# Patient Record
Sex: Female | Born: 1965 | Hispanic: No | Marital: Married | Smoking: Never smoker
Health system: Southern US, Community
[De-identification: ages and names within clinical notes are randomized; demographics above are authoritative.]

## PROBLEM LIST (undated history)

## (undated) DIAGNOSIS — M1990 Unspecified osteoarthritis, unspecified site: Secondary | ICD-10-CM

## (undated) DIAGNOSIS — F329 Major depressive disorder, single episode, unspecified: Secondary | ICD-10-CM

## (undated) DIAGNOSIS — D649 Anemia, unspecified: Secondary | ICD-10-CM

## (undated) DIAGNOSIS — G43909 Migraine, unspecified, not intractable, without status migrainosus: Secondary | ICD-10-CM

## (undated) DIAGNOSIS — F32A Depression, unspecified: Secondary | ICD-10-CM

## (undated) HISTORY — DX: Migraine, unspecified, not intractable, without status migrainosus: G43.909

## (undated) HISTORY — DX: Depression, unspecified: F32.A

## (undated) HISTORY — DX: Unspecified osteoarthritis, unspecified site: M19.90

## (undated) HISTORY — DX: Major depressive disorder, single episode, unspecified: F32.9

## (undated) HISTORY — PX: LUMBAR DISC SURGERY: SHX700

## (undated) HISTORY — DX: Anemia, unspecified: D64.9

## (undated) HISTORY — PX: KNEE ARTHROSCOPY: SUR90

## (undated) HISTORY — PX: COLONOSCOPY: SHX174

---
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
## 1998-04-12 ENCOUNTER — Other Ambulatory Visit: Admission: RE | Admit: 1998-04-12 | Discharge: 1998-04-12 | Payer: Self-pay | Admitting: Obstetrics and Gynecology

## 1998-11-05 ENCOUNTER — Inpatient Hospital Stay (HOSPITAL_COMMUNITY): Admission: AD | Admit: 1998-11-05 | Discharge: 1998-11-08 | Payer: Self-pay | Admitting: Obstetrics and Gynecology

## 1998-12-08 ENCOUNTER — Emergency Department (HOSPITAL_COMMUNITY): Admission: EM | Admit: 1998-12-08 | Discharge: 1998-12-08 | Payer: Self-pay | Admitting: Emergency Medicine

## 1998-12-08 ENCOUNTER — Encounter: Payer: Self-pay | Admitting: Emergency Medicine

## 1998-12-16 ENCOUNTER — Other Ambulatory Visit: Admission: RE | Admit: 1998-12-16 | Discharge: 1998-12-16 | Payer: Self-pay | Admitting: Obstetrics and Gynecology

## 2001-01-27 ENCOUNTER — Encounter (INDEPENDENT_AMBULATORY_CARE_PROVIDER_SITE_OTHER): Payer: Self-pay | Admitting: Specialist

## 2001-01-27 ENCOUNTER — Inpatient Hospital Stay (HOSPITAL_COMMUNITY): Admission: AD | Admit: 2001-01-27 | Discharge: 2001-01-31 | Payer: Self-pay | Admitting: Obstetrics and Gynecology

## 2001-03-09 ENCOUNTER — Other Ambulatory Visit: Admission: RE | Admit: 2001-03-09 | Discharge: 2001-03-09 | Payer: Self-pay | Admitting: Obstetrics and Gynecology

## 2002-02-07 ENCOUNTER — Encounter: Admission: RE | Admit: 2002-02-07 | Discharge: 2002-02-07 | Payer: Self-pay | Admitting: Internal Medicine

## 2002-02-07 ENCOUNTER — Encounter (HOSPITAL_BASED_OUTPATIENT_CLINIC_OR_DEPARTMENT_OTHER): Payer: Self-pay | Admitting: Internal Medicine

## 2002-03-10 ENCOUNTER — Other Ambulatory Visit: Admission: RE | Admit: 2002-03-10 | Discharge: 2002-03-10 | Payer: Self-pay | Admitting: Obstetrics and Gynecology

## 2003-06-15 ENCOUNTER — Encounter: Payer: Self-pay | Admitting: Obstetrics and Gynecology

## 2003-06-15 ENCOUNTER — Ambulatory Visit (HOSPITAL_COMMUNITY): Admission: RE | Admit: 2003-06-15 | Discharge: 2003-06-15 | Payer: Self-pay | Admitting: Obstetrics and Gynecology

## 2003-10-30 ENCOUNTER — Other Ambulatory Visit: Admission: RE | Admit: 2003-10-30 | Discharge: 2003-10-30 | Payer: Self-pay | Admitting: Obstetrics and Gynecology

## 2003-12-28 ENCOUNTER — Inpatient Hospital Stay (HOSPITAL_COMMUNITY): Admission: AD | Admit: 2003-12-28 | Discharge: 2003-12-28 | Payer: Self-pay | Admitting: Obstetrics and Gynecology

## 2004-06-27 ENCOUNTER — Inpatient Hospital Stay (HOSPITAL_COMMUNITY): Admission: AD | Admit: 2004-06-27 | Discharge: 2004-06-27 | Payer: Self-pay | Admitting: Obstetrics & Gynecology

## 2004-06-29 ENCOUNTER — Inpatient Hospital Stay (HOSPITAL_COMMUNITY): Admission: AD | Admit: 2004-06-29 | Discharge: 2004-06-29 | Payer: Self-pay | Admitting: Obstetrics & Gynecology

## 2007-01-05 ENCOUNTER — Ambulatory Visit: Payer: Self-pay | Admitting: Gastroenterology

## 2007-01-17 ENCOUNTER — Ambulatory Visit: Payer: Self-pay | Admitting: Gastroenterology

## 2008-02-28 ENCOUNTER — Emergency Department (HOSPITAL_COMMUNITY): Admission: EM | Admit: 2008-02-28 | Discharge: 2008-02-28 | Payer: Self-pay | Admitting: Emergency Medicine

## 2008-12-24 IMAGING — CT CT HEAD W/O CM
3 of 5 series · 16 of 47 positions shown, 19 images · IV contrast (agent unspecified)
Comparison: None.
COMPARISON: None.

CLINICAL DATA: Fell - complains of head and neck pain.  
 HEAD CT WITHOUT CONTRAST:
TECHNIQUE: Contiguous axial images were obtained from the base of the skull through the vertex according to standard protocol without contrast.
TECHNIQUE: Multidetector CT imaging of the cervical spine was performed.  Multiplanar CT image reconstructions were also generated.

[Series 602: coronal · coronal · 0.32mm/px · 3 of 53 slices shown]
[im 18/53  brain]
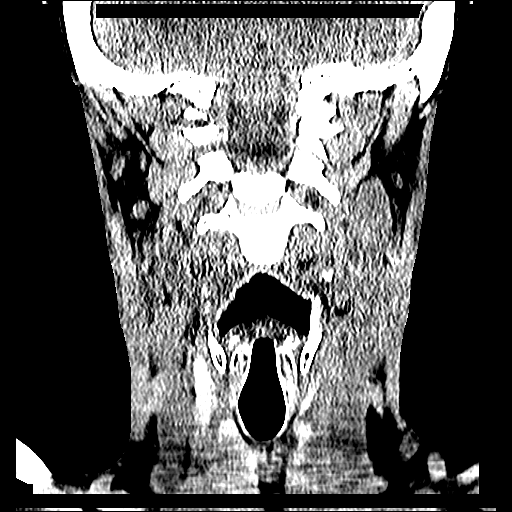
[im 24/53  brain]
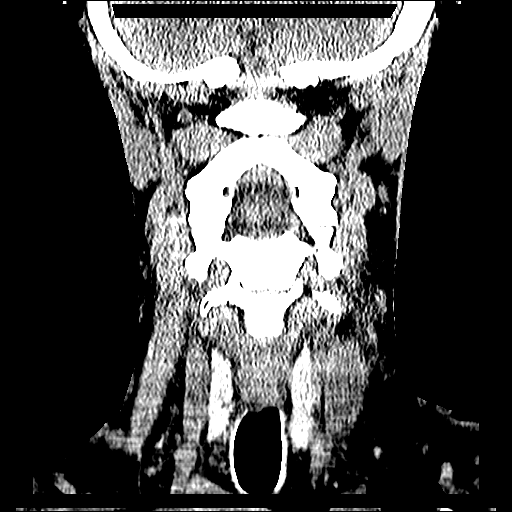
[im 29/53  brain]
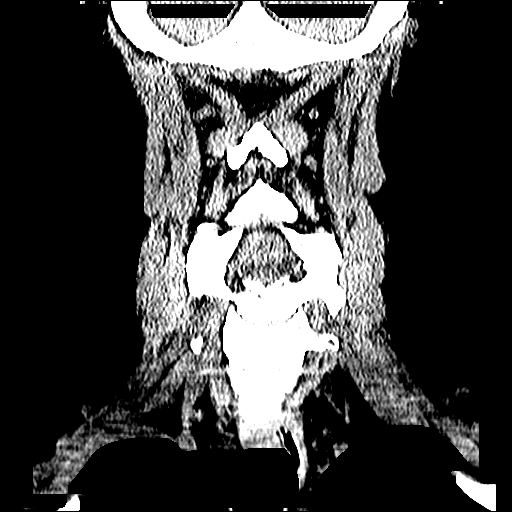

[Series 603: sagittal · sagittal · 0.32mm/px · 3 of 55 slices shown]
[im 19/55  brain]
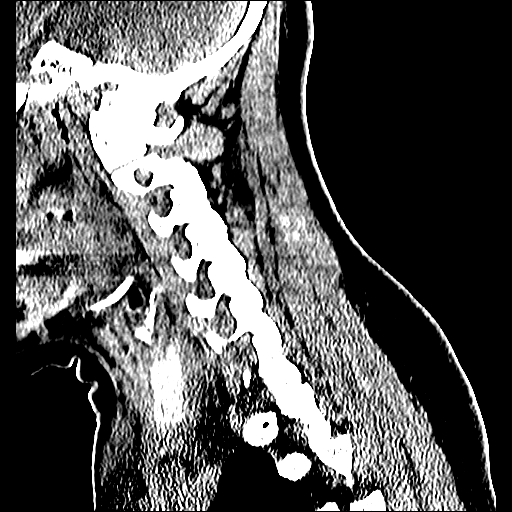
[im 28/55  brain]
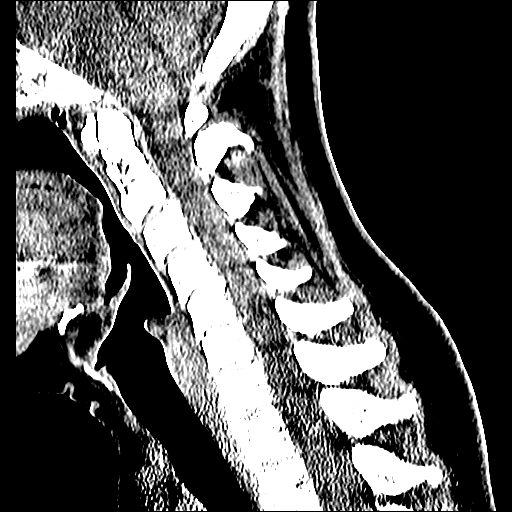
[im 37/55  brain]
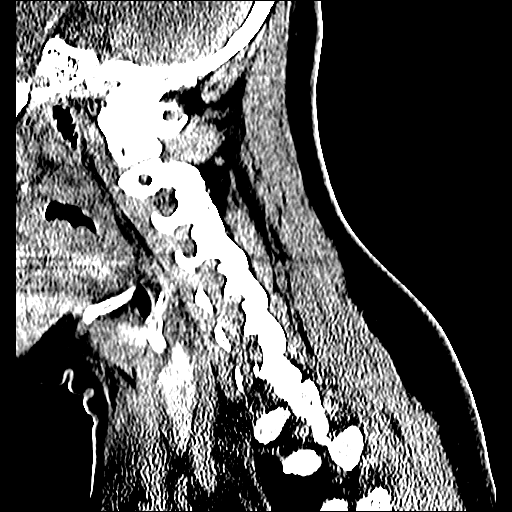

[Series 604: orthogonal · axial · 0.32mm/px · z∈[+1035,+1167]mm · 10 of 85 slices shown, 13 images]
[im 8/85  brain]
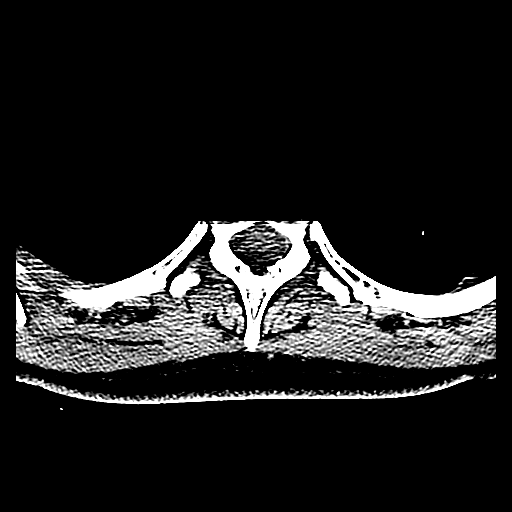
[im 8/85  bone]
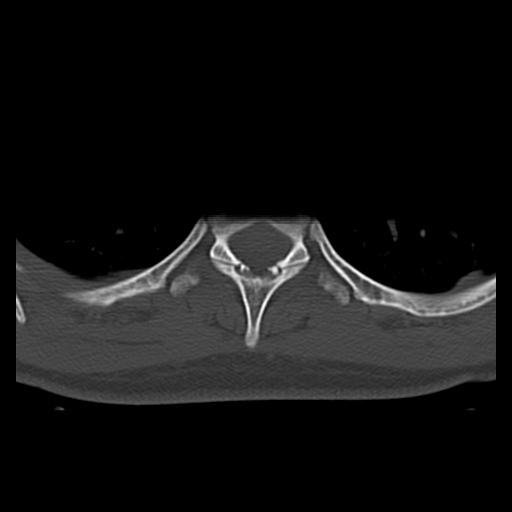
[im 15/85  brain]
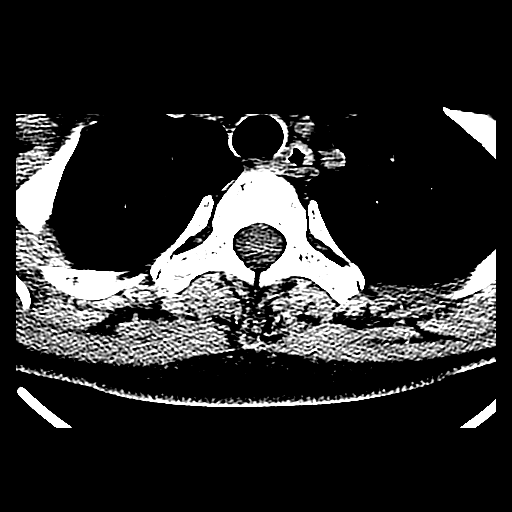
[im 22/85  brain]
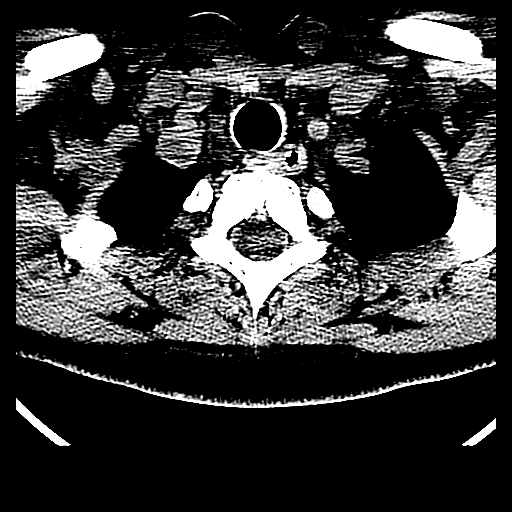
[im 29/85  brain]
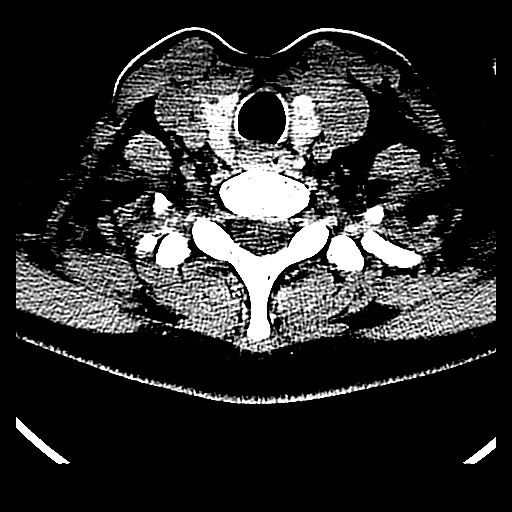
[im 36/85  brain]
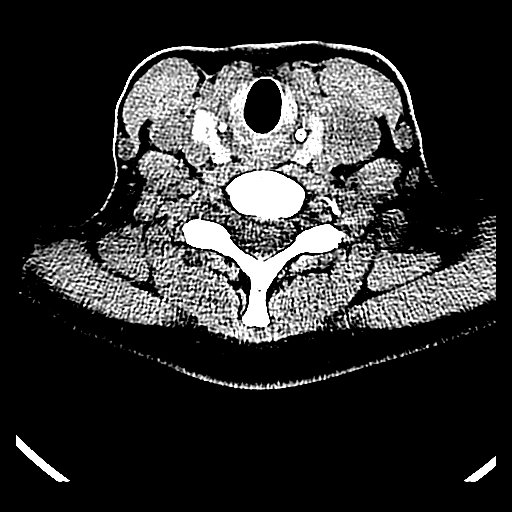
[im 36/85  bone]
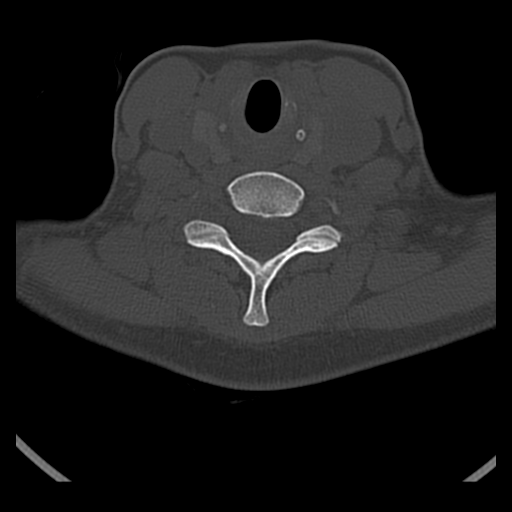
[im 50/85  brain]
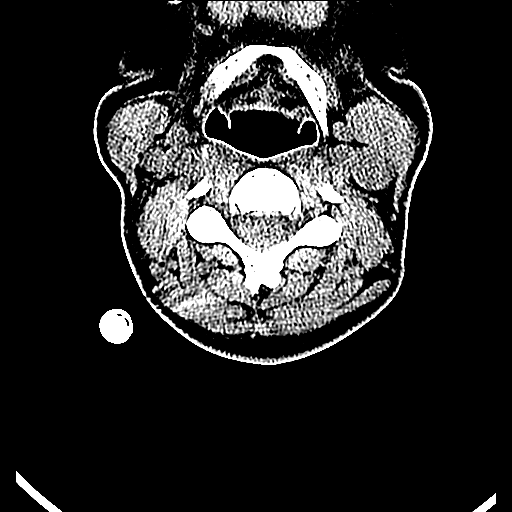
[im 57/85  brain]
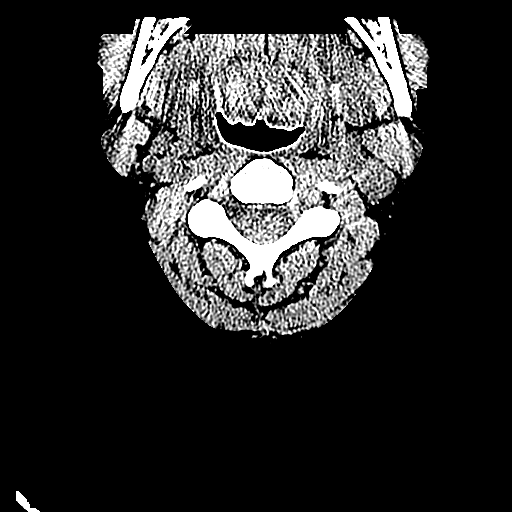
[im 64/85  brain]
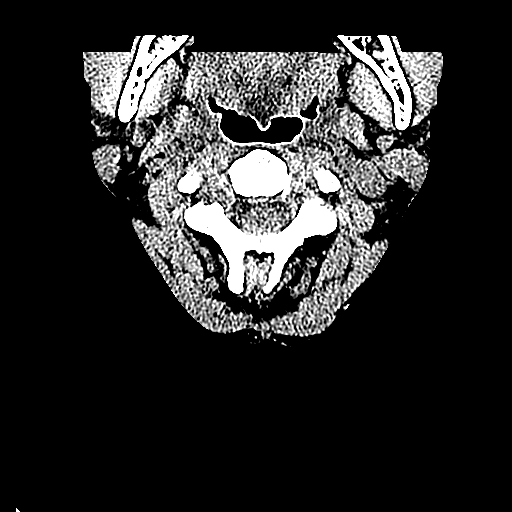
[im 71/85  brain]
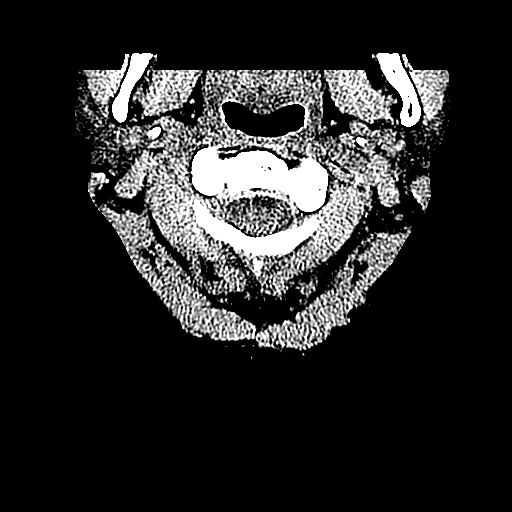
[im 71/85  bone]
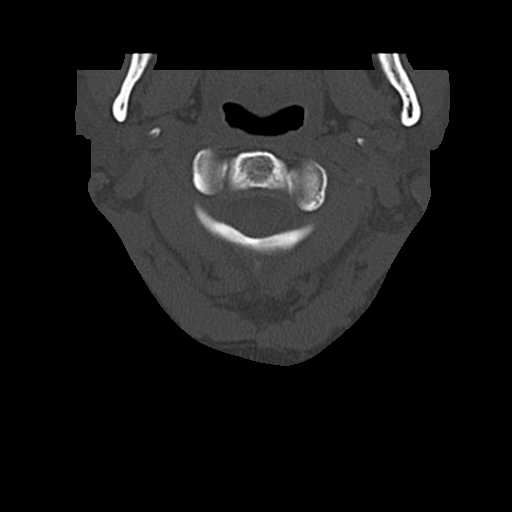
[im 78/85  brain]
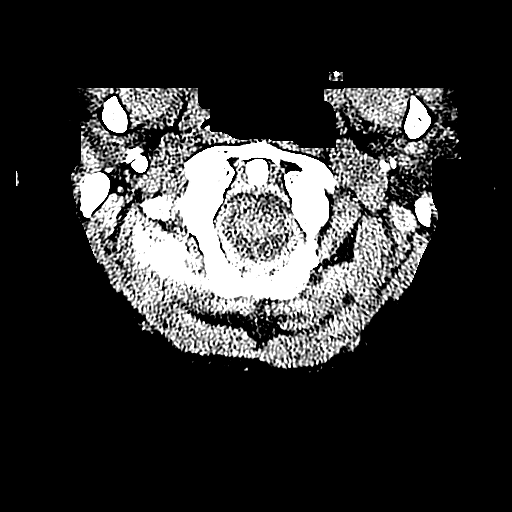

[16 of 47 positions shown; findings below may reference images not displayed]

FINDINGS: There is no evidence of intracranial hemorrhage, brain edema, acute infarct, mass lesion, or mass effect.  No other intra-axial abnormalities are seen, and the ventricles are within normal limits.  No abnormal extra-axial fluid collections or masses are identified.  No skull abnormalities are noted.
IMPRESSION: Negative non-contrast head CT.
 CERVICAL SPINE CT WITHOUT CONTRAST:
FINDINGS: In three planes, there is no subluxation or fractures.  No spinal or foraminal stenosis.  Prevertebral soft tissues normal. 
 Mild disk narrowing at C4-5.
IMPRESSION: No acute findings.

## 2011-05-12 NOTE — Consult Note (Signed)
Alicia Lopez, ATTRIDGE NO.:  [id number]   MEDICAL RECORD NO.:  [id number]          PATIENT TYPE:  EMS   LOCATION:  [facility]                         FACILITY:  [facility]   PHYSICIAN:  Donalee Citrin, M.D.        DATE OF BIRTH:  10/21/1966   DATE OF CONSULTATION:  02/28/2008  DATE OF DISCHARGE:                                 CONSULTATION   REASON FOR CONSULTATION:  Head injury.   HISTORY OF PRESENT ILLNESS:  Patient is a 45 year old female who was  walking, slipped on the ice, fell back striking the back of her head on  the ice and concrete.  The patient denies any loss of consciousness.  Denies any amnesia for the event.  Reports pain is all localized around  the occiput and some stiffness is developing in her neck.  There are no  vision changes.  No nausea or vomiting.  She is experiencing numbness  and tingling in her right hand that began only recently.  No symptoms in  the left arm or lower extremities.   PHYSICAL EXAMINATION:  GENERAL:  On exam, the patient is an awake and  alert 45 year old female in no acute distress.  HEENT:  Within normal limits.  Pupils are equal, round and reactive to  light.  Extraocular movements are intact.  Cranial nerves are intact.  NECK:  Full range of motion with some exacerbation of the stiffness in  extreme extension and flexion.  NEUROLOGIC:  Strength is 5/5 in her deltoids, biceps, triceps, wrist  flexion and hand intrinsics.  Motor strength is 5/5 in the iliopsoas,  quads, hamstrings, gastrocs and EHL Reflexes appear to be brisk, but  symmetric and nonpathologic.  Sensation to light touch is grossly  intact, although she does report paresthesias in the right hand.   RECOMMENDATIONS:  With significant occipital trauma, I have recommended  CT of her head.  We will get a  C-spine with complaints of numbness in her right hand.  If the numbness  persists, I may ultimately have to get an MRI.  If the CT scan is  normal, we will send her  home with a head injury sheet.  Otherwise, we  will defer to the results of the CT scan.           ______________________________  Donalee Citrin, M.D.     GC/MEDQ  D:  02/28/2008  T:  02/28/2008  Job:  [id number]

## 2011-05-15 NOTE — Op Note (Signed)
[facility] [place]  Patient:    Alicia Lopez, Alicia Lopez                      MRN: [id number] Proc. Date: 01/28/01 Adm. Date:  [id number] Attending:  Cordelia Pen Ii                           Operative Report  PREOPERATIVE DIAGNOSES:       1. Intrauterine pregnancy at 38-2/7 weeks.                               2. Previous cesarean section, desires repeat.  POSTOPERATIVE DIAGNOSES:      1. Intrauterine pregnancy at 38-2/7 weeks.                               2. Previous cesarean section, desires repeat.  PROCEDURES:                   1. Repeat low transverse cesarean section.                               2. Bilateral tubal ligation.  SURGEON:                      Guy Sandifer. Arleta Creek, M.D.  ANESTHESIA:                   Spinal.  ANESTHESIOLOGIST:             Ellison Hughs., M.D.  ESTIMATED BLOOD LOSS:         800 cc.  FINDINGS:                     A viable female infant; Apgars of 8 and 9 at one and five minutes, respectively.  Birth weight 8 pounds 9 ounces.  Arterial cord pH 7.18.  INDICATIONS AND CONSENT:      This patient is a 45 year old married white female, G2, P35, with an EDC of January 29, 2001.  Prenatal care was complicated by advanced maternal age, with a normal AFP noted.  The patient had previous cesarean section, desires a repeat and desires permanent sterilization.  Discussed with patient the procedure.  Discussed tubal ligation with permanence, failure rate and increased ectopic risks.  All questions answered.  DESCRIPTION OF PROCEDURE:     The patient was taken to the operating room, where a spinal anesthetic was placed.  She was then placed in the dorsosupine position with a 15-degree left lateral wedge.  She was prepped and a Foley catheter was placed, and then she was draped in a sterile fashion.  After testing for adequate spinal anesthesia, the old Pfannenstiel scar was resected on the way and dissection was carried  out in layers.  Peritoneum is incised, extended superiorly and inferiorly.  Vesicouterine peritoneum was taken down cephalolaterally, the bladder flap is developed and the bladder blade is placed.  The scar in the lower uterine segment is noted to be exceedingly thin on the uterus.  The uterus is incised in a low transverse manner in an area just above the thin scar, as noted above.  The uterine cavity is then entered bluntly with a Kelly clamp.  Clear fluid is noted.  The uterine incision is extended cephalolaterally with the fingers.  Vertex is delivered and the oronasopharynx is suctioned.  The remainder of the infant is delivered.  Good cry and tone is noted.  The cord is doubly clamped and cut, and the infant is handed to the awaiting pediatric team.  The placenta is manually delivered.  Anterior uterine contour is normal. Uterus is closed in a running locking fashion with 0 Monocryl suture.  An additional figure-of-eight is used at the right angle to achieve complete hemostasis.  Ovaries are normal bilaterally.  The left fallopian tube is then identified from cornua to fimbria, grasped at its mid ampullary portion and a knuckle of the tube is then doubly ligated with two 0 plain ties.  The intervening knuckle of tissue is then resected and cautery is used to obtain hemostasis completely.  A similar procedure is carried out on the fallopian tube.  Copious irrigation was carried out.  Anterior peritoneum was then closed in a running fashion with 0 Monocryl suture, which is also used to reapproximate the pyramidalis muscle in the midline.  The fascia was closed in running fashion with 0 Panacryl suture. The skin was closed with clips.  All sponge, needle and instrument counts were correct.  The patient was transferred to the recovery room in stable condition. DD:  01/28/01 TD:  01/29/01 Job: [id number]

## 2011-05-15 NOTE — H&P (Signed)
[facility]  Patient:    Alicia Lopez, Alicia Lopez                        MRN: [id number] Attending:  Duke Salvia. Marcelle Overlie, M.D.                         History and Physical  SCHEDULED SURGERY DATE:       January 28, 2001.  CHIEF COMPLAINT:              For repeat cesarean section, tubal ligation.  HISTORY OF PRESENT ILLNESS:   A 45 year old, G2, P1-0-0-1, previous LTCS in 1999 for macrosomia. She delivered a 9-pound 15-ounce female at that time. She declines VBAC. Her EDD is February 03, 2001. She presents now at term for repeat cesarean section and tubal ligation. She declined amniocentesis. Her AFP screen was negative. Her one-hour GTT was 66, which was normal. Blood type is A positive, rubella titer is positive.  PAST MEDICAL HISTORY:  ALLERGIES:                    None.  OPERATIONS:                   Cesarean section. Tonsillectomy. Knee surgery x 2.  REVIEW OF SYSTEMS:            Significant for IBS.  CURRENT MEDICATIONS:          Prenatal vitamins.  PHYSICAL EXAMINATION:  VITAL SIGNS:                  Temperature 98.2, blood pressure 120/72.  HEENT:                        Unremarkable.  NECK:                         Supple without masses.  LUNGS:                        Clear.  CARDIOVASCULAR:               Regular rate and rhythm without murmurs, rubs, or gallops noted.  BREASTS:                      Not examined.  ABDOMEN:                      Term fetal height. Fetal heart rate 140s.  PELVIC:                       Cervix was closed.  EXTREMITIES:                  Unremarkable.  NEUROLOGIC:                   Unremarkable.  IMPRESSION:                   1. Term intrauterine pregnancy.                               2. Previous cesarean section, declines  vaginal birth after cesarean section.  PLAN:                         Repeat cesarean section, tubal ligation. This procedure, including risks relative  to bleeding, transfusion, adjacent organ injury were discussed. The permanence of the tubal procedure and failure rate of 2-3:1000 discussed with her, which she understands and accepts also. DD:  01/24/01 TD:  01/24/01 Job: [id number]

## 2011-05-15 NOTE — Discharge Summary (Signed)
[facility] [place]  Patient:    Alicia Lopez, Alicia Lopez                      MRN: [id number] Adm. Date:  [id number] Disc. Date: [id number] Attending:  Cordelia Pen Ii Dictator:   Danie Chandler, R.N.                           Discharge Summary  ADMITTING DIAGNOSES:          1. Intrauterine pregnancy at 38 2/[redacted] weeks                                  gestation.                               2. Previous cesarean section, desires repeat.  DISCHARGE DIAGNOSES:          1. Intrauterine pregnancy at 38 2/[redacted] weeks                                  gestation.                               2. Previous cesarean section, desires repeat.  PROCEDURE:                    On January 28, 2001 repeat low transverse cesarean section and bilateral tubal ligation.  REASON FOR ADMISSION:         The patient is a 45 year old married white female gravida 2, para 1 with an estimated date of confinement January 29, 2001.  The patients prenatal care was complicated by advanced maternal age for which she underwent an AFP test and this returned as normal.  The patient had a previous cesarean section and desired a repeat and also desired permanent sterilization.  HOSPITAL COURSE:              The patient was taken to the operating room and underwent the above named procedure without complication.  This was productive of a viable female infant with Apgars of 8 at one minute and 9 at five minutes and an arterial cord pH of 7.18.  Postoperatively on day #1 the patient had good control of pain.  Her hemoglobin was 10.5, hematocrit 29.4, white blood cell count 9.0.  On postoperative day #2 she was without complaints.  She was tolerating a regular diet, ambulating well without difficulty.  She also had a good return of bowel function.  She was discharged home on postoperative day #4.  CONDITION ON DISCHARGE:       Good.  DIET:                         Regular, as tolerated.  ACTIVITY:                      No heavy lifting, no driving, no vaginal entry.  FOLLOW-UP:                    She is to follow-up in the office in one to two weeks for incision check.  She  is to call for temperature greater than 100 degrees, persistent nausea or vomiting, heavy vaginal bleeding, and/or redness or drainage from the incision site.  DISCHARGE MEDICATIONS:        1. Prenatal vitamin one p.o. q.d.                               2. Darvocet-N 100 one p.o. q.4h. p.r.n. pain.DD: 02/22/01 TD:  02/22/01 Job: [id number]

## 2012-01-08 ENCOUNTER — Encounter: Payer: Self-pay | Admitting: Gastroenterology

## 2012-05-02 ENCOUNTER — Encounter: Payer: Self-pay | Admitting: Gastroenterology

## 2012-05-25 ENCOUNTER — Encounter: Payer: Self-pay | Admitting: Gastroenterology

## 2012-06-10 ENCOUNTER — Ambulatory Visit (AMBULATORY_SURGERY_CENTER): Payer: BC Managed Care – PPO | Admitting: *Deleted

## 2012-06-10 VITALS — Ht 63.0 in | Wt 140.0 lb

## 2012-06-10 DIAGNOSIS — Z1211 Encounter for screening for malignant neoplasm of colon: Secondary | ICD-10-CM

## 2012-06-10 MED ORDER — MOVIPREP 100 G PO SOLR: ORAL | Status: DC

## 2012-06-13 ENCOUNTER — Encounter: Payer: Self-pay | Admitting: Gastroenterology

## 2012-06-24 ENCOUNTER — Ambulatory Visit (AMBULATORY_SURGERY_CENTER): Payer: BC Managed Care – PPO | Admitting: Gastroenterology

## 2012-06-24 ENCOUNTER — Encounter: Payer: Self-pay | Admitting: Gastroenterology

## 2012-06-24 VITALS — BP 115/54 | HR 88 | Temp 97.0°F | Resp 22 | Ht 63.0 in | Wt 140.0 lb

## 2012-06-24 DIAGNOSIS — Z1211 Encounter for screening for malignant neoplasm of colon: Secondary | ICD-10-CM

## 2012-06-24 DIAGNOSIS — Z800 Family history of malignant neoplasm of digestive organs: Secondary | ICD-10-CM

## 2012-06-24 DIAGNOSIS — K5730 Diverticulosis of large intestine without perforation or abscess without bleeding: Secondary | ICD-10-CM

## 2012-06-24 MED ORDER — SODIUM CHLORIDE 0.9 % IV SOLN: 500.0000 mL | INTRAVENOUS | Status: DC

## 2012-06-24 NOTE — Progress Notes (Signed)
Patient did not experience any of the following events: a burn prior to discharge; a fall within the facility; wrong site/side/patient/procedure/implant event; or a hospital transfer or hospital admission upon discharge from the facility. (G8907) Patient did not have preoperative order for IV antibiotic SSI prophylaxis. (G8918)  

## 2012-06-24 NOTE — Patient Instructions (Addendum)
YOU HAD AN ENDOSCOPIC PROCEDURE TODAY AT THE [facility]: Refer to the procedure report that was given to you for any specific questions about what was found during the examination.  If the procedure report does not answer your questions, please call your gastroenterologist to clarify.  If you requested that your care partner not be given the details of your procedure findings, then the procedure report has been included in a sealed envelope for you to review at your convenience later.  YOU SHOULD EXPECT: Some feelings of bloating in the abdomen. Passage of more gas than usual.  Walking can help get rid of the air that was put into your GI tract during the procedure and reduce the bloating. If you had a lower endoscopy (such as a colonoscopy or flexible sigmoidoscopy) you may notice spotting of blood in your stool or on the toilet paper. If you underwent a bowel prep for your procedure, then you may not have a normal bowel movement for a few days.  DIET: Your first meal following the procedure should be a light meal and then it is ok to progress to your normal diet.  A half-sandwich or bowl of soup is an example of a good first meal.  Heavy or fried foods are harder to digest and may make you feel nauseous or bloated.  Likewise meals heavy in dairy and vegetables can cause extra gas to form and this can also increase the bloating.  Drink plenty of fluids but you should avoid alcoholic beverages for 24 hours.  ACTIVITY: Your care partner should take you home directly after the procedure.  You should plan to take it easy, moving slowly for the rest of the day.  You can resume normal activity the day after the procedure however you should NOT DRIVE or use heavy machinery for 24 hours (because of the sedation medicines used during the test).    SYMPTOMS TO REPORT IMMEDIATELY: A gastroenterologist can be reached at any hour.  During normal business hours, 8:30 AM to 5:00 PM Monday through Friday,  call [phone].  After hours and on weekends, please call the GI answering service at [phone] who will take a message and have the physician on call contact you.   Following lower endoscopy (colonoscopy or flexible sigmoidoscopy):  Excessive amounts of blood in the stool  Significant tenderness or worsening of abdominal pains  Swelling of the abdomen that is new, acute  Fever of 100F or higher   FOLLOW UP:  Our staff will call the home number listed on your records the next business day following your procedure to check on you and address any questions or concerns that you may have at that time regarding the information given to you following your procedure. This is a courtesy call and so if there is no answer at the home number and we have not heard from you through the emergency physician on call, we will assume that you have returned to your regular daily activities without incident.  SIGNATURES/CONFIDENTIALITY: You and/or your care partner have signed paperwork which will be entered into your electronic medical record.  These signatures attest to the fact that that the information above on your After Visit Summary has been reviewed and is understood.  Full responsibility of the confidentiality of this discharge information lies with you and/or your care-partner.   Resume your normal medications 

## 2012-06-24 NOTE — Op Note (Signed)
[facility] [address] [place], [place]  [address]  COLONOSCOPY PROCEDURE REPORT  PATIENT:  Alicia Lopez, Alicia Lopez  MR#:  [id number] BIRTHDATE:  Feb 15, 1966, 45 yrs. old  GENDER:  female ENDOSCOPIST:  Barbette Hair. Arlyce Dice, MD REF. BY:  Jarome Matin, M.D. PROCEDURE DATE:  06/24/2012 PROCEDURE:  Diagnostic Colonoscopy ASA CLASS:  Class I INDICATIONS:  Screening, family history of colon cancer Brother in 29's MEDICATIONS:   MAC sedation, administered by CRNA propofol 400mg IV  DESCRIPTION OF PROCEDURE:   After the risks benefits and alternatives of the procedure were thoroughly explained, informed consent was obtained.  Digital rectal exam was performed and revealed no abnormalities.   The LB [id number] endoscope was introduced through the anus and advanced to the cecum, which was identified by both the appendix and ileocecal valve, without limitations.  The quality of the prep was excellent, using MiraLax.  The instrument was then slowly withdrawn as the colon was fully examined. <<PROCEDUREIMAGES>>  FINDINGS:  Mild diverticulosis was found in the sigmoid colon (see image4).  This was otherwise a normal examination of the colon (see image2 and image6).   Retroflexed views in the rectum revealed no abnormalities.    The time to cecum =  1) 4.75 minutes. The scope was then withdrawn in  1) 6.50  minutes from the cecum and the procedure completed. COMPLICATIONS:  None ENDOSCOPIC IMPRESSION: 1) Mild diverticulosis in the sigmoid colon 2) Otherwise normal examination RECOMMENDATIONS: 1) Given your significant family history of colon cancer, you should have a repeat colonoscopy in 5 years REPEAT EXAM:  In 5 year(s) for Colonoscopy.  ______________________________ Barbette Hair. Arlyce Dice, MD  CC:  n. eSIGNED:   Barbette Hair. Lizbet Cirrincione at 06/24/2012 12:23 PM  Reola Calkins, [phone]

## 2012-06-27 ENCOUNTER — Telehealth: Payer: Self-pay | Admitting: *Deleted

## 2012-06-27 NOTE — Telephone Encounter (Signed)
  Follow up Call-  Call back number 06/24/2012  Post procedure Call Back phone  # [phone]  Permission to leave phone message Yes     No answer,left message.

## 2013-05-04 ENCOUNTER — Encounter (HOSPITAL_COMMUNITY): Payer: Self-pay | Admitting: Pharmacy Technician

## 2013-05-11 ENCOUNTER — Other Ambulatory Visit: Payer: Self-pay | Admitting: Obstetrics and Gynecology

## 2013-05-16 ENCOUNTER — Encounter (HOSPITAL_COMMUNITY)
Admission: RE | Disposition: A | Discharge status: Home / Self Care | Payer: Self-pay | Source: Ambulatory Visit | Attending: Obstetrics and Gynecology

## 2013-05-16 ENCOUNTER — Ambulatory Visit (HOSPITAL_COMMUNITY)
Admission: RE | Admit: 2013-05-16 | Discharge: 2013-05-16 | Disposition: A | Discharge status: Home / Self Care | Payer: 59 | Source: Ambulatory Visit | Attending: Obstetrics and Gynecology | Admitting: Obstetrics and Gynecology

## 2013-05-16 ENCOUNTER — Encounter (HOSPITAL_COMMUNITY): Payer: Self-pay | Admitting: Anesthesiology

## 2013-05-16 ENCOUNTER — Ambulatory Visit (HOSPITAL_COMMUNITY): Payer: 59 | Admitting: Anesthesiology

## 2013-05-16 ENCOUNTER — Encounter (HOSPITAL_COMMUNITY): Payer: Self-pay | Admitting: *Deleted

## 2013-05-16 DIAGNOSIS — N938 Other specified abnormal uterine and vaginal bleeding: Secondary | ICD-10-CM | POA: Insufficient documentation

## 2013-05-16 DIAGNOSIS — N920 Excessive and frequent menstruation with regular cycle: Secondary | ICD-10-CM

## 2013-05-16 DIAGNOSIS — N949 Unspecified condition associated with female genital organs and menstrual cycle: Secondary | ICD-10-CM | POA: Insufficient documentation

## 2013-05-16 DIAGNOSIS — Z803 Family history of malignant neoplasm of breast: Secondary | ICD-10-CM | POA: Insufficient documentation

## 2013-05-16 HISTORY — PX: DILITATION & CURRETTAGE/HYSTROSCOPY WITH NOVASURE ABLATION: SHX5568

## 2013-05-16 LAB — HCG, SERUM, QUALITATIVE: Preg, Serum: NEGATIVE

## 2013-05-16 LAB — CBC
Hemoglobin: 10.9 g/dL — ABNORMAL LOW (ref 12.0–15.0)
MCHC: 32.2 g/dL (ref 30.0–36.0)
WBC: 4.2 10*3/uL (ref 4.0–10.5)

## 2013-05-16 SURGERY — DILATATION & CURETTAGE/HYSTEROSCOPY WITH NOVASURE ABLATION
Anesthesia: General | Site: Vagina | Wound class: Clean Contaminated

## 2013-05-16 MED ORDER — METOCLOPRAMIDE HCL 5 MG/ML IJ SOLN: 10.0000 mg | Freq: Once | INTRAMUSCULAR | Status: DC | PRN

## 2013-05-16 MED ORDER — MEPERIDINE HCL 25 MG/ML IJ SOLN: 6.2500 mg | INTRAMUSCULAR | Status: DC | PRN

## 2013-05-16 MED ORDER — FENTANYL CITRATE 0.05 MG/ML IJ SOLN: 25.0000 ug | INTRAMUSCULAR | Status: DC | PRN

## 2013-05-16 MED ORDER — FENTANYL CITRATE 0.05 MG/ML IJ SOLN
INTRAMUSCULAR | Status: DC | PRN
  Administered 2013-05-16 (×2): 50 ug via INTRAVENOUS

## 2013-05-16 MED ORDER — MIDAZOLAM HCL 2 MG/2ML IJ SOLN
INTRAMUSCULAR | Status: AC
  Filled 2013-05-16: qty 2

## 2013-05-16 MED ORDER — BUPIVACAINE HCL (PF) 0.25 % IJ SOLN
INTRAMUSCULAR | Status: AC
  Filled 2013-05-16: qty 30

## 2013-05-16 MED ORDER — OXYCODONE-ACETAMINOPHEN 5-325 MG PO TABS: 1.0000 | ORAL_TABLET | ORAL | Status: DC | PRN

## 2013-05-16 MED ORDER — PROPOFOL 10 MG/ML IV EMUL
INTRAVENOUS | Status: AC
  Filled 2013-05-16: qty 20

## 2013-05-16 MED ORDER — FENTANYL CITRATE 0.05 MG/ML IJ SOLN
INTRAMUSCULAR | Status: AC
  Filled 2013-05-16: qty 5

## 2013-05-16 MED ORDER — CEFAZOLIN SODIUM-DEXTROSE 2-3 GM-% IV SOLR
2.0000 g | INTRAVENOUS | Status: AC
  Administered 2013-05-16: 2 g via INTRAVENOUS

## 2013-05-16 MED ORDER — DEXAMETHASONE SODIUM PHOSPHATE 4 MG/ML IJ SOLN
INTRAMUSCULAR | Status: DC | PRN
  Administered 2013-05-16: 8 mg via INTRAVENOUS

## 2013-05-16 MED ORDER — PROPOFOL 10 MG/ML IV BOLUS
INTRAVENOUS | Status: DC | PRN
  Administered 2013-05-16: 150 mg via INTRAVENOUS

## 2013-05-16 MED ORDER — LIDOCAINE HCL (CARDIAC) 20 MG/ML IV SOLN
INTRAVENOUS | Status: DC | PRN
  Administered 2013-05-16: 50 mg via INTRAVENOUS

## 2013-05-16 MED ORDER — KETOROLAC TROMETHAMINE 30 MG/ML IJ SOLN
INTRAMUSCULAR | Status: DC | PRN
  Administered 2013-05-16: 30 mg via INTRAVENOUS

## 2013-05-16 MED ORDER — ONDANSETRON HCL 4 MG/2ML IJ SOLN
INTRAMUSCULAR | Status: DC | PRN
  Administered 2013-05-16: 4 mg via INTRAVENOUS

## 2013-05-16 MED ORDER — BUPIVACAINE HCL (PF) 0.25 % IJ SOLN
INTRAMUSCULAR | Status: DC | PRN
  Administered 2013-05-16: 20 mL

## 2013-05-16 MED ORDER — DEXAMETHASONE SODIUM PHOSPHATE 10 MG/ML IJ SOLN
INTRAMUSCULAR | Status: AC
  Filled 2013-05-16: qty 1

## 2013-05-16 MED ORDER — CEFAZOLIN SODIUM-DEXTROSE 2-3 GM-% IV SOLR
INTRAVENOUS | Status: AC
  Filled 2013-05-16: qty 50

## 2013-05-16 MED ORDER — ONDANSETRON HCL 4 MG/2ML IJ SOLN
INTRAMUSCULAR | Status: AC
  Filled 2013-05-16: qty 2

## 2013-05-16 MED ORDER — LACTATED RINGERS IR SOLN
Status: DC | PRN
  Administered 2013-05-16: 3000 mL

## 2013-05-16 MED ORDER — LACTATED RINGERS IV SOLN
INTRAVENOUS | Status: DC
  Administered 2013-05-16 (×2): via INTRAVENOUS

## 2013-05-16 MED ORDER — LIDOCAINE HCL (CARDIAC) 20 MG/ML IV SOLN
INTRAVENOUS | Status: AC
  Filled 2013-05-16: qty 5

## 2013-05-16 MED ORDER — MIDAZOLAM HCL 5 MG/5ML IJ SOLN
INTRAMUSCULAR | Status: DC | PRN
  Administered 2013-05-16: 1 mg via INTRAVENOUS

## 2013-05-16 MED ORDER — VASOPRESSIN 20 UNIT/ML IJ SOLN
INTRAMUSCULAR | Status: AC
  Filled 2013-05-16: qty 1

## 2013-05-16 SURGICAL SUPPLY — 13 items
ABLATOR ENDOMETRIAL BIPOLAR (ABLATOR) ×2 IMPLANT
CATH ROBINSON RED A/P 16FR (CATHETERS) ×2 IMPLANT
CLOTH BEACON ORANGE TIMEOUT ST (SAFETY) ×2 IMPLANT
CONTAINER PREFILL 10% NBF 60ML (FORM) ×4 IMPLANT
DRESSING TELFA 8X3 (GAUZE/BANDAGES/DRESSINGS) ×2 IMPLANT
GLOVE BIO SURGEON STRL SZ7.5 (GLOVE) ×2 IMPLANT
GOWN STRL REIN XL XLG (GOWN DISPOSABLE) ×4 IMPLANT
PACK HYSTEROSCOPY LF (CUSTOM PROCEDURE TRAY) ×2 IMPLANT
PAD OB MATERNITY 4.3X12.25 (PERSONAL CARE ITEMS) ×2 IMPLANT
PAD PREP 24X48 CUFFED NSTRL (MISCELLANEOUS) ×2 IMPLANT
SYR TB 1ML 25GX5/8 (SYRINGE) ×2 IMPLANT
TOWEL OR 17X24 6PK STRL BLUE (TOWEL DISPOSABLE) ×4 IMPLANT
WATER STERILE IRR 1000ML POUR (IV SOLUTION) ×2 IMPLANT

## 2013-05-16 NOTE — H&P (Signed)
Alicia Lopez, Alicia Lopez               ACCOUNT NO.:  [id number]  MEDICAL RECORD NO.:  [id number]  LOCATION:  PERIO                         FACILITY:  [facility]  PHYSICIAN:  Lenoard Aden, M.D.DATE OF BIRTH:  1966-12-11  DATE OF ADMISSION:  04/18/2013 DATE OF DISCHARGE:                             HISTORY & PHYSICAL   CHIEF COMPLAINT:  Menorrhagia.  HISTORY OF PRESENT ILLNESS:  She is a 47 year old white female, G2, P1, who presents with refractory menorrhagia for hysteroscopy, D and C, NovaSure.  MEDICATIONS:  Prozac and Topamax.  ALLERGIES:  No known drug allergies.  FAMILY HISTORY:  She has a family history of breast cancer, colon cancer, hypertension, and diabetes.  PERSONAL HISTORY:  Vaginal delivery x2, tubal ligation, tubal reversal, and oral surgery.  Most recent ultrasound done reveals a normal size retroflexed uterus, with no adnexal masses, questionable small fibroids versus polyps in the endometrial cavity.  PHYSICAL EXAMINATION:  GENERAL:  She is a well-developed, well- nourished, white female, in no acute distress. LUNGS:  Clear. HEART:  Regular rhythm. ABDOMEN:  Soft, nontender. PELVIC:  Retroflexed uterus and no adnexal masses. EXTREMITIES:  There are no cords. NEUROLOGIC:  Nonfocal. SKIN:  Intact.  IMPRESSION:  Menometrorrhagia for NovaSure hysteroscopy D and C, risks of anesthesia, infection, bleeding, injury to abdominal organs, need for repair was discussed, delayed versus immediate complications to include bowel and bladder injury noted.  The patient now wishes to proceed.     Lenoard Aden, M.D.    RJT/MEDQ  D:  05/15/2013  T:  05/16/2013  Job:  [id number]

## 2013-05-16 NOTE — Anesthesia Preprocedure Evaluation (Signed)
Anesthesia Evaluation  Patient identified by MRN, date of birth, ID band Patient awake    Reviewed: Allergy & Precautions, H&P , NPO status , Patient's Chart, lab work & pertinent test results  Airway Mallampati: II TM Distance: >3 FB Neck ROM: Full    Dental no notable dental hx. (+) Teeth Intact and Caps   Pulmonary neg pulmonary ROS,  breath sounds clear to auscultation  Pulmonary exam normal       Cardiovascular negative cardio ROS  Rhythm:Regular Rate:Normal     Neuro/Psych  Headaches, PSYCHIATRIC DISORDERS Depression    GI/Hepatic negative GI ROS, Neg liver ROS,   Endo/Other  negative endocrine ROS  Renal/GU negative Renal ROS  negative genitourinary   Musculoskeletal negative musculoskeletal ROS (+)   Abdominal   Peds  Hematology  (+) Blood dyscrasia, anemia ,   Anesthesia Other Findings   Reproductive/Obstetrics DUB                           Anesthesia Physical Anesthesia Plan  ASA: II  Anesthesia Plan: General   Post-op Pain Management:    Induction: Intravenous  Airway Management Planned: LMA  Additional Equipment:   Intra-op Plan:   Post-operative Plan: Extubation in OR  Informed Consent: I have reviewed the patients History and Physical, chart, labs and discussed the procedure including the risks, benefits and alternatives for the proposed anesthesia with the patient or authorized representative who has indicated his/her understanding and acceptance.   Dental advisory given  Plan Discussed with: CRNA, Anesthesiologist and Surgeon  Anesthesia Plan Comments:         Anesthesia Quick Evaluation

## 2013-05-16 NOTE — Transfer of Care (Signed)
Immediate Anesthesia Transfer of Care Note  Patient: Alicia Lopez  Procedure(s) Performed: Procedure(s): DILATATION & CURETTAGE/HYSTEROSCOPY WITH NOVASURE ABLATION (N/A)  Patient Location: PACU  Anesthesia Type:General  Level of Consciousness: awake, alert , oriented and patient cooperative  Airway & Oxygen Therapy: Patient Spontanous Breathing and Patient connected to nasal cannula oxygen  Post-op Assessment: Report given to PACU RN and Post -op Vital signs reviewed and stable  Post vital signs: stable  Complications: No apparent anesthesia complications

## 2013-05-16 NOTE — Op Note (Signed)
05/16/2013  10:19 AM  PATIENT:  Alicia Lopez  47 y.o. 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  SURGEON:  Surgeon(s): Lenoard Aden, MD  ASSISTANTS: none   ANESTHESIA:   local and general  ESTIMATED BLOOD LOSS: * No blood loss amount entered *   DRAINS: none   LOCAL MEDICATIONS USED:  MARCAINE     SPECIMEN:  Source of Specimen:  EMC  DISPOSITION OF SPECIMEN:  PATHOLOGY  COUNTS:  YES  DICTATION #: [id number]  PLAN OF CARE: DC home  PATIENT DISPOSITION:  PACU - hemodynamically stable.

## 2013-05-16 NOTE — Anesthesia Postprocedure Evaluation (Signed)
  Anesthesia Post-op Note  Patient: Alicia Lopez  Procedure(s) Performed: Procedure(s): DILATATION & CURETTAGE/HYSTEROSCOPY WITH NOVASURE ABLATION (N/A)  Patient Location: PACU  Anesthesia Type:General  Level of Consciousness: awake, alert  and oriented  Airway and Oxygen Therapy: Patient Spontanous Breathing  Post-op Pain: mild  Post-op Assessment: Post-op Vital signs reviewed, Patient's Cardiovascular Status Stable, Respiratory Function Stable, Patent Airway, No signs of Nausea or vomiting and Pain level controlled  Post-op Vital Signs: Reviewed and stable  Complications: No apparent anesthesia complications

## 2013-05-16 NOTE — Progress Notes (Signed)
Patient ID: Alicia Lopez, female   DOB: 05-03-1966, 47 y.o.   MRN: [id number] Patient seen and examined. Consent witnessed and signed. CBC    Component Value Date/Time   WBC 4.2 05/16/2013 0810   RBC 3.96 05/16/2013 0810   HGB 10.9* 05/16/2013 0810   HCT 33.9* 05/16/2013 0810   PLT 278 05/16/2013 0810   MCV 85.6 05/16/2013 0810   MCH 27.5 05/16/2013 0810   MCHC 32.2 05/16/2013 0810   RDW 13.1 05/16/2013 0810     No changes noted. Update completed.

## 2013-05-17 ENCOUNTER — Encounter (HOSPITAL_COMMUNITY): Payer: Self-pay | Admitting: Obstetrics and Gynecology

## 2013-05-17 NOTE — Op Note (Signed)
Alicia Lopez, Alicia Lopez               ACCOUNT NO.:  [id number]  MEDICAL RECORD NO.:  [id number]  LOCATION:  [facility]                          FACILITY:  [facility]  PHYSICIAN:  Lenoard Aden, M.D.DATE OF BIRTH:  [birth year]  DATE OF PROCEDURE: DATE OF DISCHARGE:  05/16/2013                              OPERATIVE REPORT   DESCRIPTION OF PROCEDURE:  After being apprised of risks of anesthesia, infection, bleeding, injury to abdominal organs, possible need for repair, delayed versus immediate complications include bowel and bladder injury, possible need for repair, the patient was brought to the operating room where she was administered general anesthetic without complications.  Prepped and draped in usual sterile fashion. Catheterized until the bladder was empty.  Exam under anesthesia reveals a retroflexed uterus and no adnexal masses.  At this time, cervix was easily dilated up to a #27 Pratt dilator.  Hysteroscope was placed. Visualization reveals evidence of some adhesions of the right cornual area and questionable small anterior wall polyp and these adhesions were lysed sharply using sharp dissection and the polyp was removed using polyp forceps.  At this time, the cavity appears normal, and D and C was performed in a 4-quadrant method.  NovaSure device was placed, set to a length of 6 cm and a width of 4.3.  The CO2 test was performed and is negative.  Procedure was initiated for 73 seconds to a power of 142 watts.  Device was removed, inspected, and found to be intact. Revisualization hysteroscopically reveals endometrial cavity to be intact without evidence of perforation.  Fluid deficit 60 mL noted.  The patient tolerated the procedure well.  All instruments were removed. The patient was transferred to recovery good condition.     Lenoard Aden, M.D.     RJT/MEDQ  D:  05/16/2013  T:  05/17/2013  Job:  [id number]

## 2014-01-17 ENCOUNTER — Other Ambulatory Visit: Payer: Self-pay | Admitting: Radiology

## 2015-06-10 ENCOUNTER — Other Ambulatory Visit: Payer: Self-pay | Admitting: Neurosurgery

## 2015-06-10 DIAGNOSIS — M4722 Other spondylosis with radiculopathy, cervical region: Secondary | ICD-10-CM

## 2015-06-24 ENCOUNTER — Ambulatory Visit
Admission: RE | Admit: 2015-06-24 | Discharge: 2015-06-24 | Disposition: A | Discharge status: Home / Self Care | Payer: PRIVATE HEALTH INSURANCE | Source: Ambulatory Visit | Attending: Neurosurgery | Admitting: Neurosurgery

## 2015-06-24 VITALS — BP 99/57 | HR 70

## 2015-06-24 DIAGNOSIS — M4722 Other spondylosis with radiculopathy, cervical region: Secondary | ICD-10-CM

## 2015-06-24 DIAGNOSIS — M5030 Other cervical disc degeneration, unspecified cervical region: Secondary | ICD-10-CM

## 2015-06-24 MED ORDER — TRIAMCINOLONE ACETONIDE 40 MG/ML IJ SUSP (RADIOLOGY)
60.0000 mg | Freq: Once | INTRAMUSCULAR | Status: AC
  Administered 2015-06-24: 60 mg via EPIDURAL

## 2015-06-24 MED ORDER — IOHEXOL 300 MG/ML  SOLN
1.0000 mL | Freq: Once | INTRAMUSCULAR | Status: AC | PRN
  Administered 2015-06-24: 1 mL via EPIDURAL

## 2015-06-24 NOTE — Discharge Instructions (Signed)

## 2015-07-16 ENCOUNTER — Encounter: Payer: Self-pay | Admitting: Gastroenterology

## 2015-09-17 ENCOUNTER — Other Ambulatory Visit: Payer: Self-pay | Admitting: Neurosurgery

## 2015-09-17 DIAGNOSIS — M5030 Other cervical disc degeneration, unspecified cervical region: Secondary | ICD-10-CM

## 2015-09-25 ENCOUNTER — Ambulatory Visit
Admission: RE | Admit: 2015-09-25 | Discharge: 2015-09-25 | Disposition: A | Discharge status: Home / Self Care | Payer: PRIVATE HEALTH INSURANCE | Source: Ambulatory Visit | Attending: Neurosurgery | Admitting: Neurosurgery

## 2015-09-25 VITALS — BP 134/57 | HR 71

## 2015-09-25 DIAGNOSIS — M5030 Other cervical disc degeneration, unspecified cervical region: Secondary | ICD-10-CM

## 2015-09-25 DIAGNOSIS — M5010 Cervical disc disorder with radiculopathy, unspecified cervical region: Secondary | ICD-10-CM

## 2015-09-25 MED ORDER — IOHEXOL 300 MG/ML  SOLN
1.0000 mL | Freq: Once | INTRAMUSCULAR | Status: DC | PRN
  Administered 2015-09-25: 1 mL via EPIDURAL

## 2015-09-25 MED ORDER — TRIAMCINOLONE ACETONIDE 40 MG/ML IJ SUSP (RADIOLOGY)
60.0000 mg | Freq: Once | INTRAMUSCULAR | Status: AC
  Administered 2015-09-25: 60 mg via EPIDURAL

## 2015-09-25 NOTE — Discharge Instructions (Signed)

## 2017-04-13 ENCOUNTER — Encounter: Payer: Self-pay | Admitting: Gastroenterology

## 2017-06-28 ENCOUNTER — Encounter: Payer: Self-pay | Admitting: Gastroenterology

## 2017-07-28 ENCOUNTER — Ambulatory Visit (AMBULATORY_SURGERY_CENTER): Payer: Self-pay

## 2017-07-28 VITALS — Ht 63.0 in | Wt 135.8 lb

## 2017-07-28 DIAGNOSIS — Z800 Family history of malignant neoplasm of digestive organs: Secondary | ICD-10-CM

## 2017-07-28 MED ORDER — SUPREP BOWEL PREP KIT 17.5-3.13-1.6 GM/177ML PO SOLN: 1.0000 | Freq: Once | ORAL | 0 refills | Status: AC

## 2017-07-28 NOTE — Progress Notes (Signed)
No allergies to eggs or soy No diet meds No home oxygen No past problems with anesthesia  Declined emmi 

## 2017-08-03 ENCOUNTER — Encounter: Payer: Self-pay | Admitting: Gastroenterology

## 2017-08-09 ENCOUNTER — Encounter: Payer: Self-pay | Admitting: Gastroenterology

## 2017-08-09 ENCOUNTER — Ambulatory Visit (AMBULATORY_SURGERY_CENTER): Payer: Managed Care, Other (non HMO) | Admitting: Gastroenterology

## 2017-08-09 VITALS — BP 118/61 | HR 74 | Temp 98.0°F | Resp 14 | Ht 63.0 in | Wt 135.0 lb

## 2017-08-09 DIAGNOSIS — Z1211 Encounter for screening for malignant neoplasm of colon: Secondary | ICD-10-CM

## 2017-08-09 DIAGNOSIS — K635 Polyp of colon: Secondary | ICD-10-CM

## 2017-08-09 DIAGNOSIS — Z800 Family history of malignant neoplasm of digestive organs: Secondary | ICD-10-CM | POA: Diagnosis present

## 2017-08-09 DIAGNOSIS — Z1212 Encounter for screening for malignant neoplasm of rectum: Secondary | ICD-10-CM | POA: Diagnosis not present

## 2017-08-09 DIAGNOSIS — D125 Benign neoplasm of sigmoid colon: Secondary | ICD-10-CM

## 2017-08-09 MED ORDER — SODIUM CHLORIDE 0.9 % IV SOLN: 500.0000 mL | INTRAVENOUS | Status: DC

## 2017-08-09 NOTE — Progress Notes (Signed)
Called to room to assist during endoscopic procedure.  Patient ID and intended procedure confirmed with present staff. Received instructions for my participation in the procedure from the performing physician.  

## 2017-08-09 NOTE — Patient Instructions (Signed)
Impression/Recommendations:  Polyp handout given to patient. Diverticulosis handout given to patient. Hemorrhoid handout given to patient.  Resume previous diet. Continue present medications. Await pathology results.  Repeat colonoscopy in 5-10 years for surveillance based on pathology results.  YOU HAD AN ENDOSCOPIC PROCEDURE TODAY AT [facility]:   Refer to the procedure report that was given to you for any specific questions about what was found during the examination.  If the procedure report does not answer your questions, please call your gastroenterologist to clarify.  If you requested that your care partner not be given the details of your procedure findings, then the procedure report has been included in a sealed envelope for you to review at your convenience later.  YOU SHOULD EXPECT: Some feelings of bloating in the abdomen. Passage of more gas than usual.  Walking can help get rid of the air that was put into your GI tract during the procedure and reduce the bloating. If you had a lower endoscopy (such as a colonoscopy or flexible sigmoidoscopy) you may notice spotting of blood in your stool or on the toilet paper. If you underwent a bowel prep for your procedure, you may not have a normal bowel movement for a few days.  Please Note:  You might notice some irritation and congestion in your nose or some drainage.  This is from the oxygen used during your procedure.  There is no need for concern and it should clear up in a day or so.  SYMPTOMS TO REPORT IMMEDIATELY:   Following lower endoscopy (colonoscopy or flexible sigmoidoscopy):  Excessive amounts of blood in the stool  Significant tenderness or worsening of abdominal pains  Swelling of the abdomen that is new, acute  Fever of 100F or higher For urgent or emergent issues, a gastroenterologist can be reached at any hour by calling [phone].   DIET:  We do recommend a small meal at first, but then you  may proceed to your regular diet.  Drink plenty of fluids but you should avoid alcoholic beverages for 24 hours.  ACTIVITY:  You should plan to take it easy for the rest of today and you should NOT DRIVE or use heavy machinery until tomorrow (because of the sedation medicines used during the test).    FOLLOW UP: Our staff will call the number listed on your records the next business day following your procedure to check on you and address any questions or concerns that you may have regarding the information given to you following your procedure. If we do not reach you, we will leave a message.  However, if you are feeling well and you are not experiencing any problems, there is no need to return our call.  We will assume that you have returned to your regular daily activities without incident.  If any biopsies were taken you will be contacted by phone or by letter within the next 1-3 weeks.  Please call us at [phone] if you have not heard about the biopsies in 3 weeks.    SIGNATURES/CONFIDENTIALITY: You and/or your care partner have signed paperwork which will be entered into your electronic medical record.  These signatures attest to the fact that that the information above on your After Visit Summary has been reviewed and is understood.  Full responsibility of the confidentiality of this discharge information lies with you and/or your care-partner.

## 2017-08-09 NOTE — Op Note (Signed)
[place] Patient Name: Alicia Lopez Procedure Date: 08/09/2017 2:32 PM MRN: [id number] Endoscopist: Mauri Pole , MD Age: 51 Referring MD:  Date of Birth: May 15, 1966 Gender: Female Account #: [id number] Procedure:                Colonoscopy Indications:              Screening for malignant neoplasm in the colon, This                            is the patient's first colonoscopy Medicines:                Monitored Anesthesia Care Procedure:                Pre-Anesthesia Assessment:                           - Prior to the procedure, a History and Physical                            was performed, and patient medications and                            allergies were reviewed. The patient's tolerance of                            previous anesthesia was also reviewed. The risks                            and benefits of the procedure and the sedation                            options and risks were discussed with the patient.                            All questions were answered, and informed consent                            was obtained. Prior Anticoagulants: The patient has                            taken no previous anticoagulant or antiplatelet                            agents. ASA Grade Assessment: II - A patient with                            mild systemic disease. After reviewing the risks                            and benefits, the patient was deemed in                            satisfactory condition to undergo the procedure.  After obtaining informed consent, the colonoscope                            was passed under direct vision. Throughout the                            procedure, the patient's blood pressure, pulse, and                            oxygen saturations were monitored continuously. The                            Colonoscope was introduced through the anus and                            advanced to the the  terminal ileum, with                            identification of the appendiceal orifice and IC                            valve. The colonoscopy was performed without                            difficulty. The patient tolerated the procedure                            well. The quality of the bowel preparation was                            excellent. The terminal ileum, ileocecal valve,                            appendiceal orifice, and rectum were photographed. Scope In: 2:55:20 PM Scope Out: 3:10:13 PM Scope Withdrawal Time: 0 hours 10 minutes 24 seconds  Total Procedure Duration: 0 hours 14 minutes 53 seconds  Findings:                 The perianal and digital rectal examinations were                            normal.                           A 2 mm polyp was found in the sigmoid colon. The                            polyp was sessile. The polyp was removed with a                            cold biopsy forceps. Resection and retrieval were                            complete.  Multiple small-mouthed diverticula were found in                            the sigmoid colon.                           Non-bleeding internal hemorrhoids were found during                            retroflexion. The hemorrhoids were small. Complications:            No immediate complications. Estimated Blood Loss:     Estimated blood loss was minimal. Impression:               - One 2 mm polyp in the sigmoid colon, removed with                            a cold biopsy forceps. Resected and retrieved.                           - Diverticulosis in the sigmoid colon.                           - Non-bleeding internal hemorrhoids. Recommendation:           - Patient has a contact number available for                            emergencies. The signs and symptoms of potential                            delayed complications were discussed with the                            patient.  Return to normal activities tomorrow.                            Written discharge instructions were provided to the                            patient.                           - Resume previous diet.                           - Continue present medications.                           - Await pathology results.                           - Repeat colonoscopy in 5-10 years for surveillance                            based on pathology results. Mauri Pole, MD 08/09/2017 3:16:07 PM This report has been signed electronically.

## 2017-08-09 NOTE — Progress Notes (Signed)
Report given to PACU, vss 

## 2017-08-10 ENCOUNTER — Telehealth: Payer: Self-pay

## 2017-08-10 NOTE — Telephone Encounter (Signed)
  Follow up Call-  Call back number 08/09/2017  Post procedure Call Back phone  # [phone]  Permission to leave phone message Yes  Some recent data might be hidden     Patient questions:  Do you have a fever, pain , or abdominal swelling? No. Pain Score  0 *  Have you tolerated food without any problems? Yes.    Have you been able to return to your normal activities? Yes.    Do you have any questions about your discharge instructions: Diet   No. Medications  No. Follow up visit  No.  Do you have questions or concerns about your Care? No.  Actions: * If pain score is 4 or above: No action needed, pain <4.

## 2017-08-16 ENCOUNTER — Encounter: Payer: Self-pay | Admitting: Gastroenterology

## 2017-08-17 ENCOUNTER — Encounter: Payer: Self-pay | Admitting: Gastroenterology

## 2019-11-16 ENCOUNTER — Other Ambulatory Visit: Payer: Self-pay

## 2019-11-16 DIAGNOSIS — Z20822 Contact with and (suspected) exposure to covid-19: Secondary | ICD-10-CM

## 2019-11-18 LAB — NOVEL CORONAVIRUS, NAA: SARS-CoV-2, NAA: DETECTED — AB

## 2022-02-05 ENCOUNTER — Other Ambulatory Visit: Payer: Self-pay | Admitting: Internal Medicine

## 2022-02-05 DIAGNOSIS — E785 Hyperlipidemia, unspecified: Secondary | ICD-10-CM

## 2022-10-15 ENCOUNTER — Encounter: Payer: Self-pay | Admitting: Gastroenterology

## 2022-10-15 ENCOUNTER — Ambulatory Visit
Admission: RE | Admit: 2022-10-15 | Discharge: 2022-10-15 | Disposition: A | Discharge status: Home / Self Care | Payer: No Typology Code available for payment source | Source: Ambulatory Visit | Attending: Internal Medicine | Admitting: Internal Medicine

## 2022-10-15 DIAGNOSIS — E785 Hyperlipidemia, unspecified: Secondary | ICD-10-CM

## 2023-03-23 ENCOUNTER — Other Ambulatory Visit: Payer: Self-pay | Admitting: Internal Medicine

## 2023-03-23 ENCOUNTER — Encounter: Payer: Self-pay | Admitting: Internal Medicine

## 2023-03-23 DIAGNOSIS — Z0000 Encounter for general adult medical examination without abnormal findings: Secondary | ICD-10-CM

## 2023-07-05 ENCOUNTER — Ambulatory Visit (AMBULATORY_SURGERY_CENTER): Payer: Managed Care, Other (non HMO) | Admitting: *Deleted

## 2023-07-05 VITALS — Ht 63.25 in | Wt 144.0 lb

## 2023-07-05 DIAGNOSIS — Z800 Family history of malignant neoplasm of digestive organs: Secondary | ICD-10-CM

## 2023-07-05 MED ORDER — NA SULFATE-K SULFATE-MG SULF 17.5-3.13-1.6 GM/177ML PO SOLN: 1.0000 | Freq: Once | ORAL | 0 refills | Status: AC

## 2023-07-05 MED ORDER — NA SULFATE-K SULFATE-MG SULF 17.5-3.13-1.6 GM/177ML PO SOLN: 1.0000 | Freq: Once | ORAL | 0 refills | Status: DC

## 2023-07-05 NOTE — Addendum Note (Signed)
Addended by: Danton Sewer on: 07/05/2023 08:27 AM   Modules accepted: Orders

## 2023-07-05 NOTE — Progress Notes (Signed)
Pt's name and DOB verified at the beginning of the pre-visit.  Pt denies any difficulty with ambulating,sitting, laying down or rolling side to side Gave both [facility] main # and MD on call # prior to instructions.  No egg or soy allergy known to patient  No issues known to pt with past sedation with any surgeries or procedures Pt denies having issues being intubated Pt has no issues moving head neck or swallowing No FH of Malignant Hyperthermia Pt is not on diet pills Pt is not on home 02  Pt is not on blood thinners  Pt denies issues with constipation  Pt is not on dialysis Pt denise any abnormal heart rhythms  Pt denies any upcoming cardiac testing Pt encouraged to use to use Singlecare or Goodrx to reduce cost  Patient's chart reviewed by Alicia Lopez CNRA prior to pre-visit and patient appropriate for the [facility].  Pre-visit completed and red dot placed by patient's name on their procedure day (on provider's schedule).  . Visit by phone Pt states weight is  Instructed pt why it is important to and  to call if they have any changes in health or new medications. Directed them to the # given and on instructions.   Pt states they will.  Instructions reviewed with pt and pt states understanding. Instructed to review again prior to procedure. Pt states they will.  Instructions sent by mail with coupon and by my chart

## 2023-07-12 ENCOUNTER — Encounter: Payer: Self-pay | Admitting: Gastroenterology

## 2023-07-18 ENCOUNTER — Encounter: Payer: Self-pay | Admitting: Certified Registered Nurse Anesthetist

## 2023-07-20 ENCOUNTER — Ambulatory Visit: Payer: Managed Care, Other (non HMO) | Admitting: Gastroenterology

## 2023-07-20 ENCOUNTER — Encounter: Payer: Self-pay | Admitting: Gastroenterology

## 2023-07-20 VITALS — BP 108/65 | HR 64 | Temp 98.4°F | Resp 11 | Ht 63.0 in | Wt 144.0 lb

## 2023-07-20 DIAGNOSIS — Z800 Family history of malignant neoplasm of digestive organs: Secondary | ICD-10-CM

## 2023-07-20 DIAGNOSIS — D122 Benign neoplasm of ascending colon: Secondary | ICD-10-CM | POA: Diagnosis not present

## 2023-07-20 DIAGNOSIS — K635 Polyp of colon: Secondary | ICD-10-CM | POA: Diagnosis not present

## 2023-07-20 DIAGNOSIS — Z1211 Encounter for screening for malignant neoplasm of colon: Secondary | ICD-10-CM | POA: Diagnosis present

## 2023-07-20 MED ORDER — SODIUM CHLORIDE 0.9 % IV SOLN: 500.0000 mL | Freq: Once | INTRAVENOUS | Status: DC

## 2023-07-20 NOTE — Patient Instructions (Signed)
Handout on hemorrhoids, diverticulosis, and polyps given to patient. Await pathology results. Resume previous diet and continue present medications. Repeat colonoscopy in 3 years for surveillance!   YOU HAD AN ENDOSCOPIC PROCEDURE TODAY AT [facility]:   Refer to the procedure report that was given to you for any specific questions about what was found during the examination.  If the procedure report does not answer your questions, please call your gastroenterologist to clarify.  If you requested that your care partner not be given the details of your procedure findings, then the procedure report has been included in a sealed envelope for you to review at your convenience later.  YOU SHOULD EXPECT: Some feelings of bloating in the abdomen. Passage of more gas than usual.  Walking can help get rid of the air that was put into your GI tract during the procedure and reduce the bloating. If you had a lower endoscopy (such as a colonoscopy or flexible sigmoidoscopy) you may notice spotting of blood in your stool or on the toilet paper. If you underwent a bowel prep for your procedure, you may not have a normal bowel movement for a few days.  Please Note:  You might notice some irritation and congestion in your nose or some drainage.  This is from the oxygen used during your procedure.  There is no need for concern and it should clear up in a day or so.  SYMPTOMS TO REPORT IMMEDIATELY:  Following lower endoscopy (colonoscopy or flexible sigmoidoscopy):  Excessive amounts of blood in the stool  Significant tenderness or worsening of abdominal pains  Swelling of the abdomen that is new, acute  Fever of 100F or higher  For urgent or emergent issues, a gastroenterologist can be reached at any hour by calling [phone]. Do not use MyChart messaging for urgent concerns.    DIET:  We do recommend a small meal at first, but then you may proceed to your regular diet.  Drink plenty of  fluids but you should avoid alcoholic beverages for 24 hours.  ACTIVITY:  You should plan to take it easy for the rest of today and you should NOT DRIVE or use heavy machinery until tomorrow (because of the sedation medicines used during the test).    FOLLOW UP: Our staff will call the number listed on your records the next business day following your procedure.  We will call around 7:15- 8:00 am to check on you and address any questions or concerns that you may have regarding the information given to you following your procedure. If we do not reach you, we will leave a message.     If any biopsies were taken you will be contacted by phone or by letter within the next 1-3 weeks.  Please call us at [phone] if you have not heard about the biopsies in 3 weeks.    SIGNATURES/CONFIDENTIALITY: You and/or your care partner have signed paperwork which will be entered into your electronic medical record.  These signatures attest to the fact that that the information above on your After Visit Summary has been reviewed and is understood.  Full responsibility of the confidentiality of this discharge information lies with you and/or your care-partner.

## 2023-07-20 NOTE — Progress Notes (Signed)
Called to room to assist during endoscopic procedure.  Patient ID and intended procedure confirmed with present staff. Received instructions for my participation in the procedure from the performing physician.  

## 2023-07-20 NOTE — Progress Notes (Unsigned)
Report given to PACU, vss 

## 2023-07-20 NOTE — Op Note (Signed)
[facility] Patient Name: Alicia Lopez Procedure Date: 07/20/2023 9:01 AM MRN: [id number] Endoscopist: Napoleon Form , MD, [id number] Age: 57 Referring MD:  Date of Birth: 1966-11-24 Gender: Female Account #: [id number] Procedure:                Colonoscopy Indications:              Screening in patient at increased risk: Family                            history of 1st-degree relative with colorectal                            cancer Medicines:                Monitored Anesthesia Care Procedure:                Pre-Anesthesia Assessment:                           - Prior to the procedure, a History and Physical                            was performed, and patient medications and                            allergies were reviewed. The patient's tolerance of                            previous anesthesia was also reviewed. The risks                            and benefits of the procedure and the sedation                            options and risks were discussed with the patient.                            All questions were answered, and informed consent                            was obtained. Prior Anticoagulants: The patient has                            taken no anticoagulant or antiplatelet agents. ASA                            Grade Assessment: I - A normal, healthy patient.                            After reviewing the risks and benefits, the patient                            was deemed in satisfactory condition to undergo the  procedure.                           After obtaining informed consent, the colonoscope                            was passed under direct vision. Throughout the                            procedure, the patient's blood pressure, pulse, and                            oxygen saturations were monitored continuously. The                            [id number]-HQ190L Colonoscope [id number] was introduced                             through the anus and advanced to the the cecum,                            identified by appendiceal orifice and ileocecal                            valve. The colonoscopy was performed without                            difficulty. The patient tolerated the procedure                            well. The quality of the bowel preparation was                            good. The ileocecal valve, appendiceal orifice, and                            rectum were photographed. Scope In: 9:09:29 AM Scope Out: 9:26:39 AM Scope Withdrawal Time: 0 hours 12 minutes 39 seconds  Total Procedure Duration: 0 hours 17 minutes 10 seconds  Findings:                 The perianal and digital rectal examinations were                            normal.                           A 18 mm polyp was found in the ascending colon. The                            polyp was sessile. The polyp was removed with a                            lift and cut technique using a cold snare.  Resection and retrieval were complete.                           External and internal hemorrhoids were found during                            retroflexion. The hemorrhoids were medium-sized.                           Scattered small-mouthed diverticula were found in                            the sigmoid colon and descending colon. Complications:            No immediate complications. Estimated Blood Loss:     Estimated blood loss was minimal. Impression:               - One 18 mm polyp in the ascending colon, removed                            using lift and cut and a cold snare. Resected and                            retrieved.                           - External and internal hemorrhoids.                           - Diverticulosis in the sigmoid colon and in the                            descending colon. Recommendation:           - Patient has a contact number available for                             emergencies. The signs and symptoms of potential                            delayed complications were discussed with the                            patient. Return to normal activities tomorrow.                            Written discharge instructions were provided to the                            patient.                           - Resume previous diet.                           - Continue present medications.                           -  Await pathology results.                           - Repeat colonoscopy in 3 years for surveillance                            after piecemeal polypectomy. Napoleon Form, MD 07/20/2023 9:37:07 AM This report has been signed electronically.

## 2023-07-20 NOTE — Progress Notes (Unsigned)
Gastroenterology History and Physical   Primary Care Physician:  Garlan Fillers, MD   Reason for Procedure:  Family h/o colon cancer, h/o colon polyps  Plan:    Surveillance colonoscopy with possible interventions as needed     HPI: Alicia Lopez is a very pleasant 57 y.o. female here for surveillance colonoscopy. Denies any nausea, vomiting, abdominal pain, melena or bright red blood per rectum  The risks and benefits as well as alternatives of endoscopic procedure(s) have been discussed and reviewed. All questions answered. The patient agrees to proceed.    Past Medical History:  Diagnosis Date   Anemia    low iron   Arthritis    Depression    Migraine headache     Past Surgical History:  Procedure Laterality Date   CESAREAN SECTION  1999 & 2002   COLONOSCOPY     DILITATION & CURRETTAGE/HYSTROSCOPY WITH NOVASURE ABLATION N/A 05/16/2013   Procedure: DILATATION & CURETTAGE/HYSTEROSCOPY WITH NOVASURE ABLATION;  Surgeon: Lenoard Aden, MD;  Location: [facility];  Service: Gynecology;  Laterality: N/A;   KNEE ARTHROSCOPY  1984 &1985   x2 right   LUMBAR DISC SURGERY     TONSILLECTOMY  12/28/1969    Prior to Admission medications   Medication Sig Start Date End Date Taking? Authorizing Provider  Ascorbic Acid (VITAMIN C) 1000 MG tablet Take 1,000 mg by mouth daily.   Yes [provider]  COLLAGEN PO Take by mouth.   Yes [provider]  escitalopram (LEXAPRO) 10 MG tablet  10/09/20  Yes [provider]  Multiple Vitamin (MULTIVITAMIN) tablet Take 1 tablet by mouth daily.   Yes [provider]    Current Outpatient Medications  Medication Sig Dispense Refill   Ascorbic Acid (VITAMIN C) 1000 MG tablet Take 1,000 mg by mouth daily.     COLLAGEN PO Take by mouth.     escitalopram (LEXAPRO) 10 MG tablet      Multiple Vitamin (MULTIVITAMIN) tablet Take 1 tablet by mouth daily.     Current Facility-Administered Medications   Medication Dose Route Frequency Provider Last Rate Last Admin   0.9 %  sodium chloride infusion  500 mL Intravenous Once Napoleon Form, MD        Allergies as of 07/20/2023   (No Known Allergies)    Family History  Problem Relation Age of Onset   Colon cancer Brother    Breast cancer Maternal Grandmother    Colon polyps Neg Hx    Esophageal cancer Neg Hx    Rectal cancer Neg Hx    Stomach cancer Neg Hx     Social History   Socioeconomic History   Marital status: Married    Spouse name: Not on file   Number of children: Not on file   Years of education: Not on file   Highest education level: Not on file  Occupational History   Not on file  Tobacco Use   Smoking status: Never   Smokeless tobacco: Never  Vaping Use   Vaping status: Never Used  Substance and Sexual Activity   Alcohol use: Yes    Alcohol/week: 4.0 - 5.0 standard drinks of alcohol    Types: 4 - 5 Glasses of wine per week   Drug use: No   Sexual activity: Not on file  Other Topics Concern   Not on file  Social History Narrative   Not on file   Social Determinants of Health   Financial Resource Strain:  Not on file  Food Insecurity: Not on file  Transportation Needs: Not on file  Physical Activity: Not on file  Stress: Not on file  Social Connections: Not on file  Intimate Partner Violence: Not on file    Review of Systems:  All other review of systems negative except as mentioned in the HPI.  Physical Exam: Vital signs in last 24 hours: BP 111/76   Pulse 74   Temp 98.4 F (36.9 C) (Skin)   Resp 14   Ht 5\' 3"  (1.6 m)   Wt 144 lb (65.3 kg)   SpO2 100%   BMI 25.51 kg/m  General:   Alert, NAD Lungs:  Clear .   Heart:  Regular rate and rhythm Abdomen:  Soft, nontender and nondistended. Neuro/Psych:  Alert and cooperative. Normal mood and affect. A and O x 3  Reviewed labs, radiology imaging, old records and pertinent past GI work up  Patient is appropriate for planned  procedure(s) and anesthesia in an ambulatory setting   K. Scherry Ran , MD [phone]

## 2023-07-21 ENCOUNTER — Telehealth: Payer: Self-pay

## 2023-07-21 NOTE — Telephone Encounter (Signed)
  Follow up Call-     07/20/2023    8:20 AM  Call back number  Post procedure Call Back phone  # [phone]  Permission to leave phone message Yes     Patient questions:  Do you have a fever, pain , or abdominal swelling? No. Pain Score  0 *  Have you tolerated food without any problems? Yes.    Have you been able to return to your normal activities? Yes.    Do you have any questions about your discharge instructions: Diet   No. Medications  No. Follow up visit  No.  Do you have questions or concerns about your Care? No.  Actions: * If pain score is 4 or above: No action needed, pain <4.

## 2023-07-22 ENCOUNTER — Encounter: Payer: Self-pay | Admitting: Gastroenterology

## 2023-08-11 ENCOUNTER — Other Ambulatory Visit: Payer: Self-pay | Admitting: Obstetrics and Gynecology

## 2023-08-11 DIAGNOSIS — R928 Other abnormal and inconclusive findings on diagnostic imaging of breast: Secondary | ICD-10-CM

## 2023-08-24 ENCOUNTER — Other Ambulatory Visit: Payer: Managed Care, Other (non HMO)

## 2023-08-26 ENCOUNTER — Ambulatory Visit: Payer: Managed Care, Other (non HMO)

## 2023-08-26 ENCOUNTER — Ambulatory Visit
Admission: RE | Admit: 2023-08-26 | Discharge: 2023-08-26 | Disposition: A | Discharge status: Home / Self Care | Payer: Managed Care, Other (non HMO) | Source: Ambulatory Visit | Attending: Obstetrics and Gynecology | Admitting: Obstetrics and Gynecology

## 2023-08-26 DIAGNOSIS — R928 Other abnormal and inconclusive findings on diagnostic imaging of breast: Secondary | ICD-10-CM
